# Patient Record
Sex: Female | Born: 1995 | Race: White | Hispanic: No | Marital: Single
Health system: Northeastern US, Academic
[De-identification: ages and names within clinical notes are randomized; demographics above are authoritative.]

---

## 2017-01-06 ENCOUNTER — Emergency Department
Admission: EM | Admit: 2017-01-06 | Discharge: 2017-01-06 | Disposition: A | Discharge status: Home / Self Care | Payer: 59 | Attending: Student in an Organized Health Care Education/Training Program | Admitting: Student in an Organized Health Care Education/Training Program

## 2017-01-06 ENCOUNTER — Emergency Department: Payer: 59

## 2017-01-06 DIAGNOSIS — Z3A09 9 weeks gestation of pregnancy: Secondary | ICD-10-CM | POA: Diagnosis not present

## 2017-01-06 DIAGNOSIS — O9A211 Injury, poisoning and certain other consequences of external causes complicating pregnancy, first trimester: Secondary | ICD-10-CM | POA: Insufficient documentation

## 2017-01-06 DIAGNOSIS — O99311 Alcohol use complicating pregnancy, first trimester: Secondary | ICD-10-CM | POA: Diagnosis not present

## 2017-01-06 DIAGNOSIS — W1809XA Striking against other object with subsequent fall, initial encounter: Secondary | ICD-10-CM | POA: Insufficient documentation

## 2017-01-06 DIAGNOSIS — S069X1A Unspecified intracranial injury with loss of consciousness of 30 minutes or less, initial encounter: Secondary | ICD-10-CM | POA: Insufficient documentation

## 2017-01-06 DIAGNOSIS — F10920 Alcohol use, unspecified with intoxication, uncomplicated: Secondary | ICD-10-CM

## 2017-01-06 DIAGNOSIS — F10120 Alcohol abuse with intoxication, uncomplicated: Secondary | ICD-10-CM | POA: Insufficient documentation

## 2017-01-06 DIAGNOSIS — Y9389 Activity, other specified: Secondary | ICD-10-CM | POA: Insufficient documentation

## 2017-01-06 DIAGNOSIS — Y999 Unspecified external cause status: Secondary | ICD-10-CM | POA: Insufficient documentation

## 2017-01-06 DIAGNOSIS — Y929 Unspecified place or not applicable: Secondary | ICD-10-CM | POA: Insufficient documentation

## 2017-01-06 DIAGNOSIS — S060X1A Concussion with loss of consciousness of 30 minutes or less, initial encounter: Secondary | ICD-10-CM

## 2017-01-06 DIAGNOSIS — F10929 Alcohol use, unspecified with intoxication, unspecified: Secondary | ICD-10-CM

## 2017-01-06 DIAGNOSIS — R55 Syncope and collapse: Secondary | ICD-10-CM

## 2017-01-06 DIAGNOSIS — S0990XA Unspecified injury of head, initial encounter: Secondary | ICD-10-CM

## 2017-01-06 LAB — CBC
HEMATOCRIT: 41.7 % (ref 35.0–47.0)
HEMOGLOBIN: 13.9 g/dL (ref 12.0–16.0)
MCH: 27.2 pg (ref 26.0–34.0)
MCHC: 33.2 g/dL (ref 32.0–36.0)
MCV: 81.8 fL (ref 80.0–100.0)
PLATELETS: 293 10*3/uL (ref 150–440)
RBC: 5.09 MIL/uL (ref 3.80–5.20)
RDW: 15.2 % — ABNORMAL HIGH (ref 11.5–14.5)
WBC: 7.2 10*3/uL (ref 3.6–11.0)

## 2017-01-06 LAB — BASIC METABOLIC PANEL
ANION GAP: 6 (ref 5–15)
BUN: 9 mg/dL (ref 6–20)
CO2: 26 mmol/L (ref 22–32)
Calcium: 9 mg/dL (ref 8.9–10.3)
Chloride: 109 mmol/L (ref 101–111)
Creatinine, Ser: 0.62 mg/dL (ref 0.44–1.00)
GFR calc Af Amer: >60 mL/min (ref >60)
GLUCOSE: 96 mg/dL (ref 65–99)
POTASSIUM: 3.8 mmol/L (ref 3.5–5.1)
Sodium: 141 mmol/L (ref 135–145)

## 2017-01-06 LAB — HCG, QUANTITATIVE, PREGNANCY: hCG, Beta Chain, Quant, S: <1 m[IU]/mL (ref <5)

## 2017-01-06 MED ORDER — ONDANSETRON HCL 4 MG/2ML IJ SOLN
4.0000 mg | Freq: Once | INTRAMUSCULAR | Status: AC
Start: 2017-01-06 — End: 2017-01-06
  Administered 2017-01-06: 4 mg via INTRAVENOUS
  Filled 2017-01-06: qty 2

## 2017-01-06 MED ORDER — SODIUM CHLORIDE 0.9 % IV BOLUS (SEPSIS)
1000.0000 mL | Freq: Once | INTRAVENOUS | Status: AC
  Administered 2017-01-06: 1000 mL via INTRAVENOUS

## 2017-01-06 MED ORDER — PROMETHAZINE HCL 12.5 MG PO TABS: 12.5000 mg | ORAL_TABLET | Freq: Four times a day (QID) | ORAL | 0 refills | Status: AC | PRN

## 2017-01-06 NOTE — ED Notes (Signed)
Patient transported to CT 
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
## 2017-01-06 NOTE — ED Notes (Signed)
Pt called for transport home. Pending arrival to d/c.

## 2017-01-06 NOTE — ED Notes (Signed)
Delrae AlfredFriend, Katherine, [phone], going home to await pt

## 2017-01-06 NOTE — Discharge Instructions (Signed)
Your pregnancy test was negative in the ER today.  Blood work taken tonight was reassuring.  The CT scan of your head shows no injury but you have likely sustained a concussion.  Please see the attached information regarding concussion symptoms.

## 2017-01-06 NOTE — ED Notes (Signed)
Pt not in room when checked on for ride update. Pt ambulated in room prior to leaving without difficulty. IV removed per pt request. No d/c vitals recorded due to leaving without d/c papers.
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
## 2017-03-09 ENCOUNTER — Emergency Department: Admission: EM | Admit: 2017-03-09 | Discharge: 2017-03-09 | Discharge status: Absent without leave | Payer: 59

## 2017-07-16 IMAGING — CT CT HEAD W/O CM
3 series · 15 of 45 positions shown, 18 images · non-contrast
Comparison: None.

CLINICAL DATA: Altered mental status.

EXAM:
CT HEAD WITHOUT CONTRAST
TECHNIQUE: Contiguous axial images were obtained from the base of the skull
through the vertex without intravenous contrast.

[Series 3: head wo · axial · 0.40mm/px · z∈[-67,+48]mm · 9 of 28 slices shown, 12 images]
[im 3/28  brain]
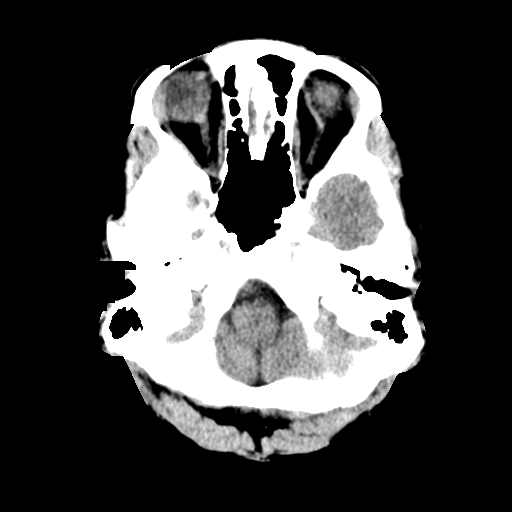
[im 3/28  bone]
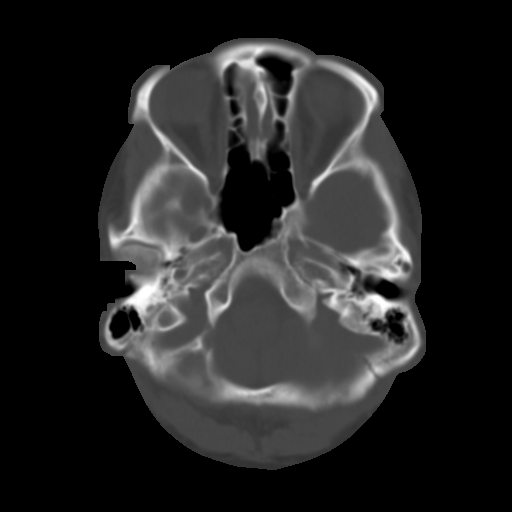
[im 6/28  brain]
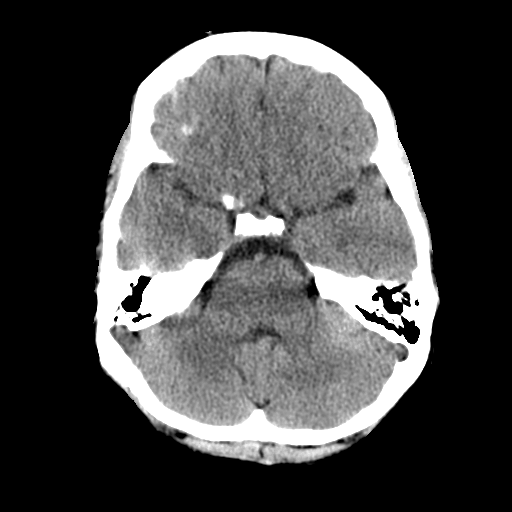
[im 9/28  brain]
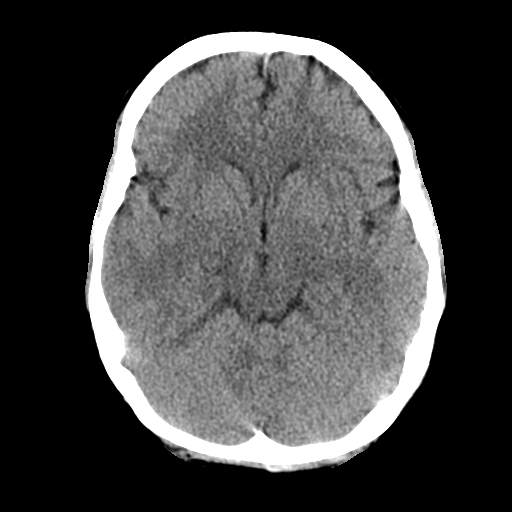
[im 12/28  brain]
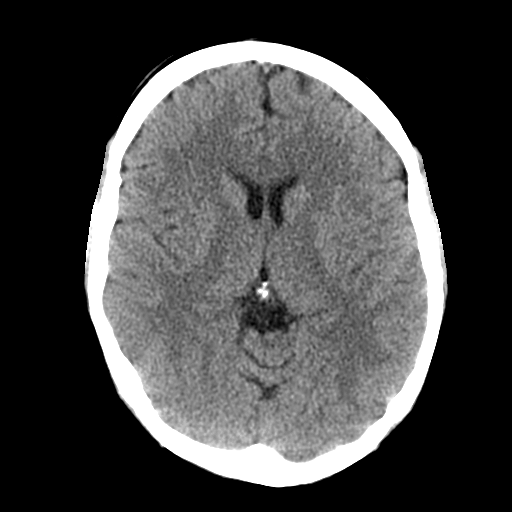
[im 15/28  brain]
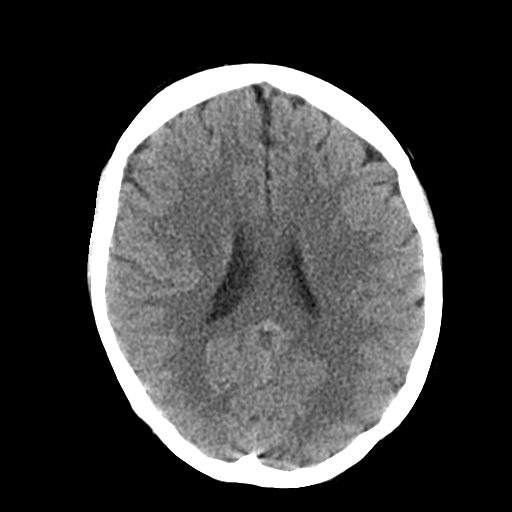
[im 15/28  bone]
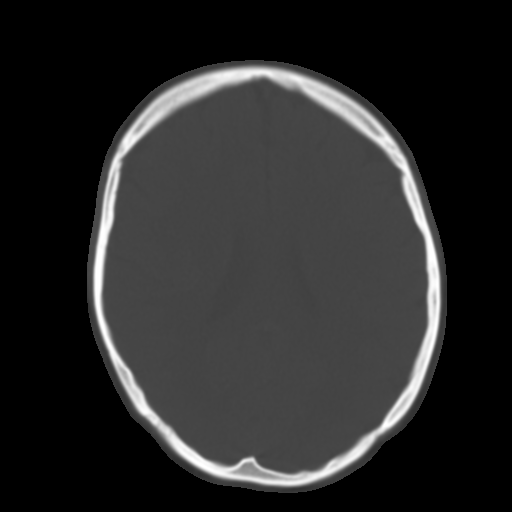
[im 17/28  brain]
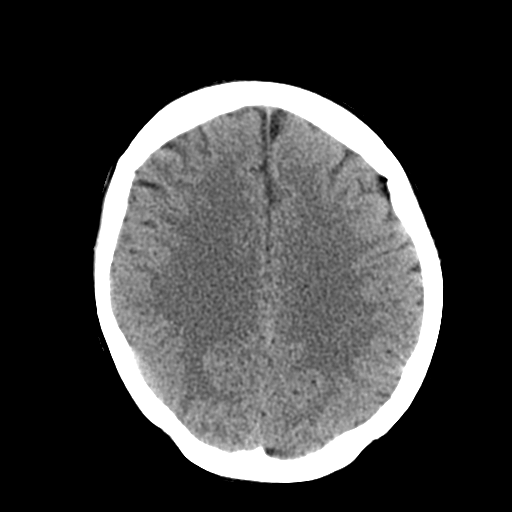
[im 20/28  brain]
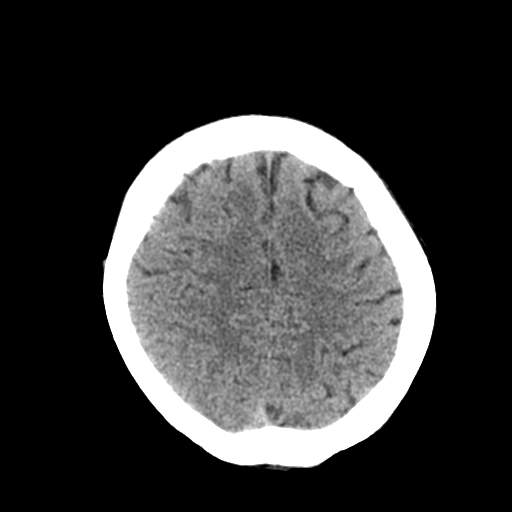
[im 23/28  brain]
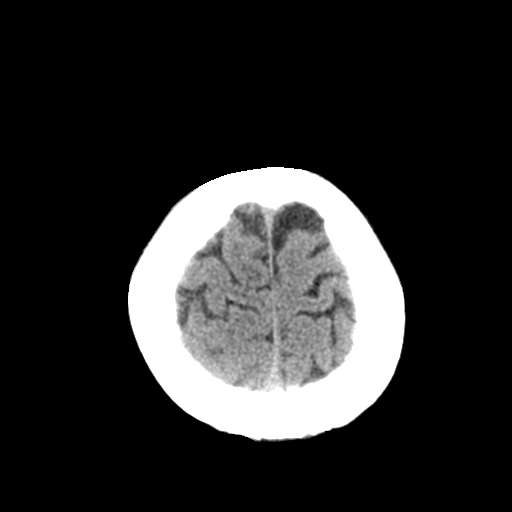
[im 26/28  brain]
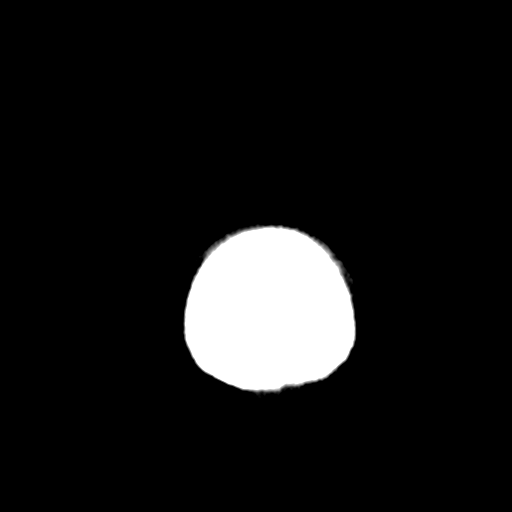
[im 26/28  bone]
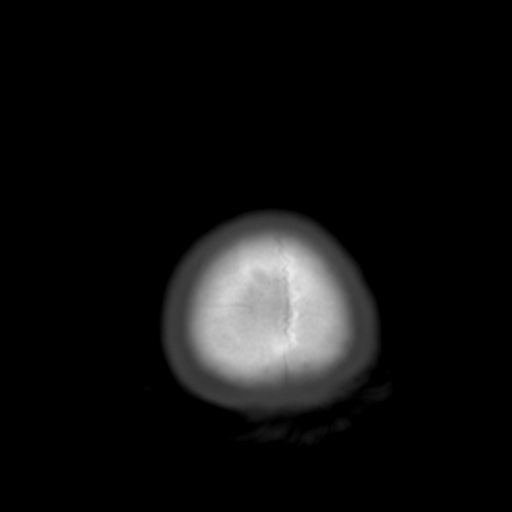

[Series 4: coronal soft tissue · coronal · 0.27mm/px · 3 of 60 slices shown]
[im 20/60  brain]
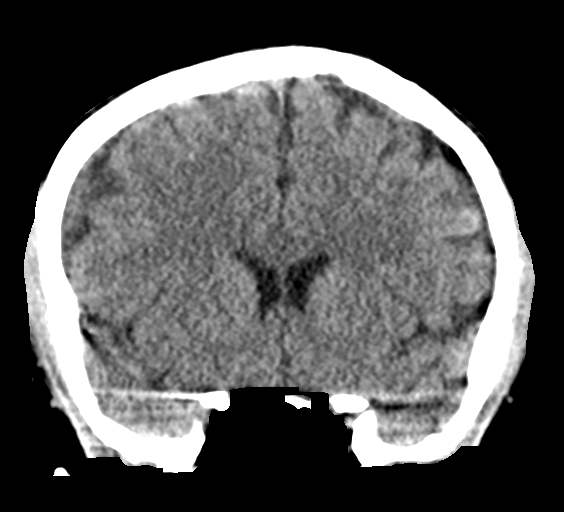
[im 27/60  brain]
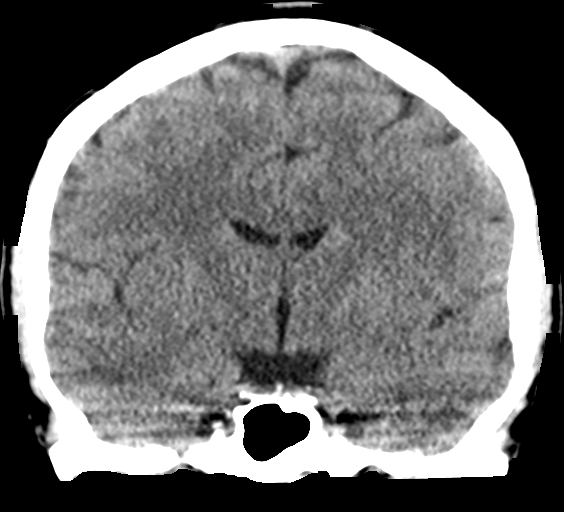
[im 33/60  brain]
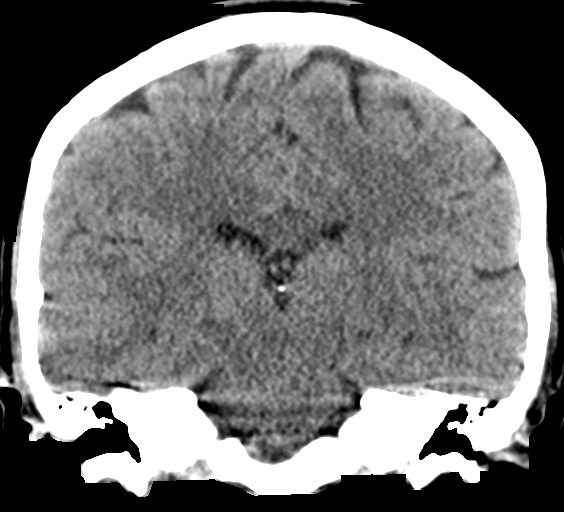

[Series 5: sagittal soft tissue · sagittal · 0.25mm/px · 3 of 48 slices shown]
[im 16/48  brain]
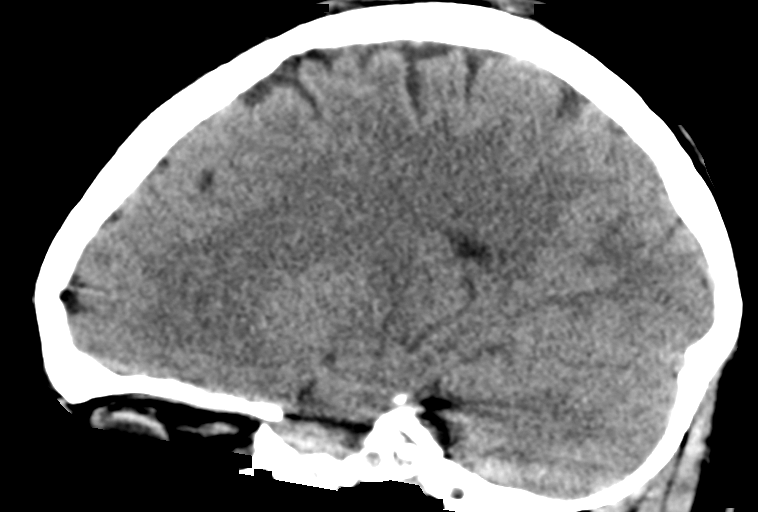
[im 24/48  brain]
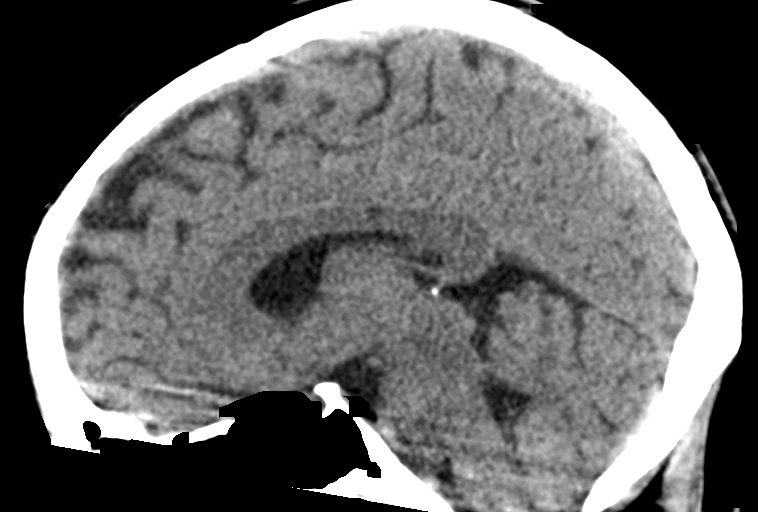
[im 32/48  brain]
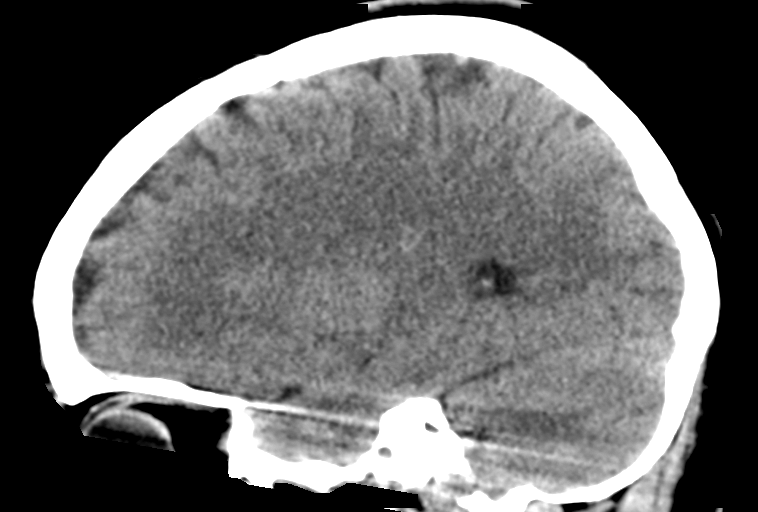

[15 of 45 positions shown; findings below may reference images not displayed]

FINDINGS: Brain: There is no intracranial hemorrhage, mass or evidence of
acute infarction. There is no extra-axial fluid collection. Gray
matter and white matter appear normal. Cerebral volume is normal for
age. Brainstem and posterior fossa are unremarkable. The CSF spaces
appear normal.

Vascular: No hyperdense vessel or unexpected calcification.

Skull: Normal. Negative for fracture or focal lesion.

Sinuses/Orbits: No acute finding.

Other: None.
IMPRESSION: Normal brain

## 2023-01-23 NOTE — Telephone Encounter (Signed)
Mathew from patient access calling to cancel visit for patient as patient currently does not have PCP and will not be able to get referral in time of visit

## 2023-02-07 ENCOUNTER — Ambulatory Visit
Payer: BLUE CROSS/BLUE SHIELD | Attending: Neurology | Primary: Student in an Organized Health Care Education/Training Program

## 2024-12-30 ENCOUNTER — Ambulatory Visit: Payer: BLUE CROSS/BLUE SHIELD | Primary: Student in an Organized Health Care Education/Training Program
